# Patient Record
Sex: Female | Born: 1960 | Race: Black or African American | Hispanic: No | Marital: Single | Smoking: Current every day smoker
Health system: Southern US, Community
[De-identification: ages and names within clinical notes are randomized; demographics above are authoritative.]

## PROBLEM LIST (undated history)

## (undated) DIAGNOSIS — E119 Type 2 diabetes mellitus without complications: Secondary | ICD-10-CM

## (undated) DIAGNOSIS — Z720 Tobacco use: Secondary | ICD-10-CM

## (undated) DIAGNOSIS — C801 Malignant (primary) neoplasm, unspecified: Secondary | ICD-10-CM

## (undated) DIAGNOSIS — G629 Polyneuropathy, unspecified: Secondary | ICD-10-CM

## (undated) HISTORY — PX: BREAST SURGERY: SHX581

## (undated) HISTORY — PX: TONSILLECTOMY: SUR1361

## (undated) HISTORY — PX: KNEE ARTHROSCOPY: SUR90

---

## 2014-05-20 ENCOUNTER — Encounter (HOSPITAL_BASED_OUTPATIENT_CLINIC_OR_DEPARTMENT_OTHER): Payer: Self-pay | Admitting: *Deleted

## 2014-05-20 ENCOUNTER — Emergency Department (HOSPITAL_BASED_OUTPATIENT_CLINIC_OR_DEPARTMENT_OTHER)
Admission: EM | Admit: 2014-05-20 | Discharge: 2014-05-20 | Disposition: A | Discharge status: Home / Self Care | Payer: Self-pay | Attending: Emergency Medicine | Admitting: Emergency Medicine

## 2014-05-20 DIAGNOSIS — E119 Type 2 diabetes mellitus without complications: Secondary | ICD-10-CM | POA: Insufficient documentation

## 2014-05-20 DIAGNOSIS — Z720 Tobacco use: Secondary | ICD-10-CM | POA: Insufficient documentation

## 2014-05-20 DIAGNOSIS — N61 Inflammatory disorders of breast: Secondary | ICD-10-CM | POA: Insufficient documentation

## 2014-05-20 DIAGNOSIS — N611 Abscess of the breast and nipple: Secondary | ICD-10-CM

## 2014-05-20 MED ORDER — HYDROCODONE-ACETAMINOPHEN 5-325 MG PO TABS: 1.0000 | ORAL_TABLET | Freq: Four times a day (QID) | ORAL | Status: DC | PRN

## 2014-05-20 MED ORDER — AMOXICILLIN-POT CLAVULANATE 875-125 MG PO TABS
1.0000 | ORAL_TABLET | Freq: Once | ORAL | Status: AC
  Administered 2014-05-20: 1 via ORAL
  Filled 2014-05-20: qty 1

## 2014-05-20 MED ORDER — HYDROCODONE-ACETAMINOPHEN 5-325 MG PO TABS
1.0000 | ORAL_TABLET | Freq: Once | ORAL | Status: AC
  Administered 2014-05-20: 1 via ORAL
  Filled 2014-05-20: qty 1

## 2014-05-20 MED ORDER — AMOXICILLIN-POT CLAVULANATE 875-125 MG PO TABS: 1.0000 | ORAL_TABLET | Freq: Two times a day (BID) | ORAL | Status: AC

## 2014-05-20 NOTE — ED Provider Notes (Signed)
CSN: [id number]     Arrival date & time 05/20/14  1527 History   First MD Initiated Contact with Patient 05/20/14 1555     Chief Complaint  Patient presents with  . Abscess     Patient is a 54 y.o. female presenting with abscess. The history is provided by the patient. No language interpreter was used.  Abscess  Ms. Bun presents for evaluation of right breast pain. She's had pain and swelling in the right breast for the last 5 days. She has a history of infection in that breast about 12 years ago that required surgery. She is a diabetic and takes metformin. She denies any other medical problems. She denies any fevers, vomiting, abdominal pain, dysuria. Symptoms are moderate, constant, worsening.  History reviewed. No pertinent past medical history. Past Surgical History  Procedure Laterality Date  . Knee arthroscopy    . Tonsillectomy     No family history on file. History  Substance Use Topics  . Smoking status: Current Every Day Smoker -- 0.50 packs/day    Types: Cigarettes  . Smokeless tobacco: Not on file  . Alcohol Use: No   OB History    No data available     Review of Systems  All other systems reviewed and are negative.     Allergies  Review of patient's allergies indicates no known allergies.  Home Medications   Prior to Admission medications   Not on File   BP 121/83 mmHg  Pulse 86  Temp(Src) 98.4 F (36.9 C) (Oral)  Resp 18  Ht 5\' 8"  (1.727 m)  Wt 230 lb (104.327 kg)  BMI 34.98 kg/m2  SpO2 100% Physical Exam  Constitutional: She is oriented to person, place, and time. She appears well-developed and well-nourished.  HENT:  Head: Normocephalic and atraumatic.  Cardiovascular: Normal rate and regular rhythm.   Pulmonary/Chest: Effort normal. No respiratory distress.  Right breast with 4x4cm area of erythema, induration and tenderness along right lateral areola consistent with abscess.  There is no surrounding cellulitis.    Abdominal: Soft.  There is no tenderness. There is no rebound and no guarding.  Musculoskeletal: She exhibits no edema or tenderness.  Neurological: She is alert and oriented to person, place, and time.  Skin: Skin is warm and dry.  Psychiatric: She has a normal mood and affect. Her behavior is normal.  Nursing note and vitals reviewed.   ED Course  Procedures (including critical care time) Labs Review Labs Reviewed - No data to display  Imaging Review No results found.   EKG Interpretation None      MDM   Final diagnoses:  Breast abscess    Patient here for evaluation of right breast pain. Exam demonstrates an abscess without surrounding cellulitis. Patient has no systemic symptoms and is nontoxic appearing. Discussed with Dr. Hassell Done who is on-call for surgery, will arrange for follow-up in the acute clinic tomorrow. Recommend starting patient on Augmentin. Discussed with patient and care and return precautions as well as the importance of very close follow-up.    Quintella Reichert, MD 05/20/14 [phone]

## 2014-05-20 NOTE — Discharge Instructions (Signed)

## 2014-05-20 NOTE — ED Notes (Signed)
Pain in her right breast for a week. Hot to touch, painful and swollen.

## 2014-05-21 ENCOUNTER — Observation Stay (HOSPITAL_COMMUNITY)
Admission: EM | Admit: 2014-05-21 | Discharge: 2014-05-22 | Disposition: A | Discharge status: Home / Self Care | Payer: Self-pay | Attending: Surgery | Admitting: Surgery

## 2014-05-21 ENCOUNTER — Encounter (HOSPITAL_COMMUNITY): Payer: Self-pay

## 2014-05-21 ENCOUNTER — Observation Stay (HOSPITAL_COMMUNITY): Payer: Self-pay | Admitting: Certified Registered Nurse Anesthetist

## 2014-05-21 ENCOUNTER — Encounter (HOSPITAL_COMMUNITY)
Admission: EM | Disposition: A | Discharge status: Home / Self Care | Payer: Self-pay | Source: Home / Self Care | Attending: Emergency Medicine

## 2014-05-21 DIAGNOSIS — E119 Type 2 diabetes mellitus without complications: Secondary | ICD-10-CM

## 2014-05-21 DIAGNOSIS — F17210 Nicotine dependence, cigarettes, uncomplicated: Secondary | ICD-10-CM | POA: Insufficient documentation

## 2014-05-21 DIAGNOSIS — Z720 Tobacco use: Secondary | ICD-10-CM

## 2014-05-21 DIAGNOSIS — N611 Abscess of the breast and nipple: Secondary | ICD-10-CM | POA: Diagnosis present

## 2014-05-21 DIAGNOSIS — N61 Inflammatory disorders of breast: Principal | ICD-10-CM | POA: Insufficient documentation

## 2014-05-21 HISTORY — DX: Tobacco use: Z72.0

## 2014-05-21 HISTORY — DX: Type 2 diabetes mellitus without complications: E11.9

## 2014-05-21 HISTORY — PX: IRRIGATION AND DEBRIDEMENT ABSCESS: SHX5252

## 2014-05-21 LAB — CBC WITH DIFFERENTIAL/PLATELET
BASOS PCT: 1 % (ref 0–1)
Basophils Absolute: 0.1 10*3/uL (ref 0.0–0.1)
EOS ABS: 0.2 10*3/uL (ref 0.0–0.7)
Eosinophils Relative: 2 % (ref 0–5)
HCT: 38.4 % (ref 36.0–46.0)
HEMOGLOBIN: 12.9 g/dL (ref 12.0–15.0)
LYMPHS ABS: 3.4 10*3/uL (ref 0.7–4.0)
LYMPHS PCT: 37 % (ref 12–46)
MCH: 28.5 pg (ref 26.0–34.0)
MCHC: 33.6 g/dL (ref 30.0–36.0)
MCV: 85 fL (ref 78.0–100.0)
Monocytes Absolute: 0.7 10*3/uL (ref 0.1–1.0)
Monocytes Relative: 8 % (ref 3–12)
Neutro Abs: 4.8 10*3/uL (ref 1.7–7.7)
Neutrophils Relative %: 52 % (ref 43–77)
Platelets: 226 10*3/uL (ref 150–400)
RBC: 4.52 MIL/uL (ref 3.87–5.11)
RDW: 14.4 % (ref 11.5–15.5)
WBC: 9.1 10*3/uL (ref 4.0–10.5)

## 2014-05-21 LAB — I-STAT CHEM 8, ED
BUN: 12 mg/dL (ref 6–23)
CHLORIDE: 104 mmol/L (ref 96–112)
CREATININE: 0.8 mg/dL (ref 0.50–1.10)
Calcium, Ion: 1.19 mmol/L (ref 1.12–1.23)
Glucose, Bld: 193 mg/dL — ABNORMAL HIGH (ref 70–99)
HEMATOCRIT: 40 % (ref 36.0–46.0)
HEMOGLOBIN: 13.6 g/dL (ref 12.0–15.0)
POTASSIUM: 3.8 mmol/L (ref 3.5–5.1)
Sodium: 140 mmol/L (ref 135–145)
TCO2: 23 mmol/L (ref 0–100)

## 2014-05-21 LAB — SURGICAL PCR SCREEN
MRSA, PCR: NEGATIVE
STAPHYLOCOCCUS AUREUS: NEGATIVE

## 2014-05-21 LAB — PREGNANCY, URINE: Preg Test, Ur: NEGATIVE

## 2014-05-21 LAB — GLUCOSE, CAPILLARY
GLUCOSE-CAPILLARY: 98 mg/dL (ref 70–99)
GLUCOSE-CAPILLARY: 104 mg/dL — AB (ref 70–99)
Glucose-Capillary: 140 mg/dL — ABNORMAL HIGH (ref 70–99)
Glucose-Capillary: 206 mg/dL — ABNORMAL HIGH (ref 70–99)

## 2014-05-21 SURGERY — IRRIGATION AND DEBRIDEMENT ABSCESS
Anesthesia: General | Laterality: Right

## 2014-05-21 MED ORDER — MORPHINE SULFATE 4 MG/ML IJ SOLN
4.0000 mg | Freq: Once | INTRAMUSCULAR | Status: AC
  Administered 2014-05-21: 4 mg via INTRAVENOUS
  Filled 2014-05-21: qty 1

## 2014-05-21 MED ORDER — FENTANYL CITRATE 0.05 MG/ML IJ SOLN
INTRAMUSCULAR | Status: DC | PRN
  Administered 2014-05-21: 100 ug via INTRAVENOUS
  Administered 2014-05-21: 50 ug via INTRAVENOUS

## 2014-05-21 MED ORDER — LACTATED RINGERS IV SOLN: INTRAVENOUS | Status: DC

## 2014-05-21 MED ORDER — BUPIVACAINE-EPINEPHRINE (PF) 0.25% -1:200000 IJ SOLN
INTRAMUSCULAR | Status: AC
  Filled 2014-05-21: qty 30

## 2014-05-21 MED ORDER — FENTANYL CITRATE 0.05 MG/ML IJ SOLN: 25.0000 ug | INTRAMUSCULAR | Status: DC | PRN

## 2014-05-21 MED ORDER — FENTANYL CITRATE 0.05 MG/ML IJ SOLN
INTRAMUSCULAR | Status: AC
  Filled 2014-05-21: qty 5

## 2014-05-21 MED ORDER — ROCURONIUM BROMIDE 100 MG/10ML IV SOLN
INTRAVENOUS | Status: AC
  Filled 2014-05-21: qty 1

## 2014-05-21 MED ORDER — GLYCOPYRROLATE 0.2 MG/ML IJ SOLN
INTRAMUSCULAR | Status: AC
  Filled 2014-05-21: qty 2

## 2014-05-21 MED ORDER — LACTATED RINGERS IV SOLN
INTRAVENOUS | Status: DC
  Administered 2014-05-21: 15:00:00 via INTRAVENOUS

## 2014-05-21 MED ORDER — LIDOCAINE HCL (CARDIAC) 20 MG/ML IV SOLN
INTRAVENOUS | Status: DC | PRN
  Administered 2014-05-21: 50 mg via INTRAVENOUS

## 2014-05-21 MED ORDER — NEOSTIGMINE METHYLSULFATE 10 MG/10ML IV SOLN
INTRAVENOUS | Status: DC | PRN
  Administered 2014-05-21: 3 mg via INTRAVENOUS

## 2014-05-21 MED ORDER — INSULIN ASPART 100 UNIT/ML ~~LOC~~ SOLN: 0.0000 [IU] | Freq: Three times a day (TID) | SUBCUTANEOUS | Status: DC

## 2014-05-21 MED ORDER — ACETAMINOPHEN 325 MG PO TABS: 650.0000 mg | ORAL_TABLET | Freq: Four times a day (QID) | ORAL | Status: DC | PRN

## 2014-05-21 MED ORDER — ONDANSETRON HCL 4 MG/2ML IJ SOLN: 4.0000 mg | Freq: Four times a day (QID) | INTRAMUSCULAR | Status: DC | PRN

## 2014-05-21 MED ORDER — MIDAZOLAM HCL 2 MG/2ML IJ SOLN
INTRAMUSCULAR | Status: AC
  Filled 2014-05-21: qty 2

## 2014-05-21 MED ORDER — HYDROCODONE-ACETAMINOPHEN 5-325 MG PO TABS
1.0000 | ORAL_TABLET | ORAL | Status: DC | PRN
  Administered 2014-05-21: 2 via ORAL
  Administered 2014-05-22: 1 via ORAL
  Filled 2014-05-21: qty 1
  Filled 2014-05-21: qty 2

## 2014-05-21 MED ORDER — ROCURONIUM BROMIDE 100 MG/10ML IV SOLN
INTRAVENOUS | Status: DC | PRN
  Administered 2014-05-21: 10 mg via INTRAVENOUS

## 2014-05-21 MED ORDER — POTASSIUM CHLORIDE IN NACL 20-0.9 MEQ/L-% IV SOLN
INTRAVENOUS | Status: DC
  Administered 2014-05-21: 50 mL/h via INTRAVENOUS
  Filled 2014-05-21 (×2): qty 1000

## 2014-05-21 MED ORDER — ENOXAPARIN SODIUM 40 MG/0.4ML ~~LOC~~ SOLN
40.0000 mg | SUBCUTANEOUS | Status: DC
  Administered 2014-05-21: 40 mg via SUBCUTANEOUS
  Filled 2014-05-21 (×2): qty 0.4

## 2014-05-21 MED ORDER — SODIUM CHLORIDE 0.9 % IV SOLN
3.0000 g | Freq: Four times a day (QID) | INTRAVENOUS | Status: DC
  Administered 2014-05-21 – 2014-05-22 (×3): 3 g via INTRAVENOUS
  Filled 2014-05-21 (×6): qty 3

## 2014-05-21 MED ORDER — ONDANSETRON HCL 4 MG/2ML IJ SOLN
INTRAMUSCULAR | Status: DC | PRN
  Administered 2014-05-21: 4 mg via INTRAVENOUS

## 2014-05-21 MED ORDER — LIDOCAINE HCL (CARDIAC) 20 MG/ML IV SOLN
INTRAVENOUS | Status: AC
  Filled 2014-05-21: qty 5

## 2014-05-21 MED ORDER — ACETAMINOPHEN 650 MG RE SUPP: 650.0000 mg | Freq: Four times a day (QID) | RECTAL | Status: DC | PRN

## 2014-05-21 MED ORDER — PROPOFOL 10 MG/ML IV BOLUS
INTRAVENOUS | Status: DC | PRN
  Administered 2014-05-21: 50 mg via INTRAVENOUS
  Administered 2014-05-21: 150 mg via INTRAVENOUS

## 2014-05-21 MED ORDER — HYDROCODONE-ACETAMINOPHEN 5-325 MG PO TABS: 1.0000 | ORAL_TABLET | ORAL | Status: DC | PRN

## 2014-05-21 MED ORDER — ACETAMINOPHEN 325 MG PO TABS: 650.0000 mg | ORAL_TABLET | ORAL | Status: DC | PRN

## 2014-05-21 MED ORDER — ONDANSETRON HCL 4 MG/2ML IJ SOLN
INTRAMUSCULAR | Status: AC
  Filled 2014-05-21: qty 2

## 2014-05-21 MED ORDER — SUCCINYLCHOLINE CHLORIDE 20 MG/ML IJ SOLN
INTRAMUSCULAR | Status: DC | PRN
  Administered 2014-05-21: 100 mg via INTRAVENOUS

## 2014-05-21 MED ORDER — BUPIVACAINE HCL (PF) 0.25 % IJ SOLN
INTRAMUSCULAR | Status: DC | PRN
  Administered 2014-05-21: 10 mL

## 2014-05-21 MED ORDER — HYDROMORPHONE HCL 1 MG/ML IJ SOLN: 1.0000 mg | INTRAMUSCULAR | Status: DC | PRN

## 2014-05-21 MED ORDER — NEOSTIGMINE METHYLSULFATE 10 MG/10ML IV SOLN
INTRAVENOUS | Status: AC
  Filled 2014-05-21: qty 1

## 2014-05-21 MED ORDER — MORPHINE SULFATE 2 MG/ML IJ SOLN
2.0000 mg | INTRAMUSCULAR | Status: DC | PRN
Start: 2014-05-21 — End: 2014-05-22
  Administered 2014-05-21: 2 mg via INTRAVENOUS
  Filled 2014-05-21: qty 1

## 2014-05-21 MED ORDER — PROPOFOL 10 MG/ML IV BOLUS
INTRAVENOUS | Status: AC
  Filled 2014-05-21: qty 20

## 2014-05-21 MED ORDER — ONDANSETRON HCL 4 MG PO TABS: 4.0000 mg | ORAL_TABLET | Freq: Four times a day (QID) | ORAL | Status: DC | PRN

## 2014-05-21 MED ORDER — GLYCOPYRROLATE 0.2 MG/ML IJ SOLN
INTRAMUSCULAR | Status: DC | PRN
  Administered 2014-05-21: 0.4 mg via INTRAVENOUS

## 2014-05-21 MED ORDER — LACTATED RINGERS IV SOLN
INTRAVENOUS | Status: DC
  Administered 2014-05-21: 1000 mL via INTRAVENOUS

## 2014-05-21 MED ORDER — MIDAZOLAM HCL 5 MG/5ML IJ SOLN
INTRAMUSCULAR | Status: DC | PRN
Start: 2014-05-21 — End: 2014-05-21
  Administered 2014-05-21: 2 mg via INTRAVENOUS

## 2014-05-21 MED ORDER — 0.9 % SODIUM CHLORIDE (POUR BTL) OPTIME
TOPICAL | Status: DC | PRN
  Administered 2014-05-21: 1000 mL

## 2014-05-21 SURGICAL SUPPLY — 33 items
BENZOIN TINCTURE PRP APPL 2/3 (GAUZE/BANDAGES/DRESSINGS) IMPLANT
BLADE HEX COATED 2.75 (ELECTRODE) ×3 IMPLANT
BLADE SURG SZ10 CARB STEEL (BLADE) ×3 IMPLANT
CLOSURE WOUND 1/2 X4 (GAUZE/BANDAGES/DRESSINGS)
DECANTER SPIKE VIAL GLASS SM (MISCELLANEOUS) IMPLANT
DRAIN CHANNEL RND F F (WOUND CARE) IMPLANT
DRAPE LAPAROTOMY T 102X78X121 (DRAPES) IMPLANT
DRAPE LAPAROTOMY TRNSV 102X78 (DRAPE) IMPLANT
DRAPE SHEET LG 3/4 BI-LAMINATE (DRAPES) IMPLANT
ELECT REM PT RETURN 9FT ADLT (ELECTROSURGICAL) ×3
ELECTRODE REM PT RTRN 9FT ADLT (ELECTROSURGICAL) ×1 IMPLANT
EVACUATOR SILICONE 100CC (DRAIN) IMPLANT
GAUZE IODOFORM PACK 1/2 7832 (GAUZE/BANDAGES/DRESSINGS) ×3 IMPLANT
GAUZE SPONGE 4X4 12PLY STRL (GAUZE/BANDAGES/DRESSINGS) ×3 IMPLANT
GLOVE BIOGEL PI IND STRL 7.0 (GLOVE) ×1 IMPLANT
GLOVE BIOGEL PI INDICATOR 7.0 (GLOVE) ×2
GLOVE SURG ORTHO 8.0 STRL STRW (GLOVE) ×3 IMPLANT
GOWN STRL REUS W/TWL LRG LVL3 (GOWN DISPOSABLE) ×3 IMPLANT
GOWN STRL REUS W/TWL XL LVL3 (GOWN DISPOSABLE) ×6 IMPLANT
KIT BASIN OR (CUSTOM PROCEDURE TRAY) ×3 IMPLANT
MARKER SKIN DUAL TIP RULER LAB (MISCELLANEOUS) IMPLANT
NEEDLE HYPO 25X1 1.5 SAFETY (NEEDLE) ×3 IMPLANT
NS IRRIG 1000ML POUR BTL (IV SOLUTION) ×3 IMPLANT
PACK GENERAL/GYN (CUSTOM PROCEDURE TRAY) ×3 IMPLANT
SPONGE LAP 18X18 X RAY DECT (DISPOSABLE) IMPLANT
STAPLER VISISTAT 35W (STAPLE) IMPLANT
STRIP CLOSURE SKIN 1/2X4 (GAUZE/BANDAGES/DRESSINGS) IMPLANT
SUT ETHILON 3 0 PS 1 (SUTURE) IMPLANT
SUT MNCRL AB 4-0 PS2 18 (SUTURE) IMPLANT
SUT VIC AB 3-0 SH 18 (SUTURE) IMPLANT
SYR CONTROL 10ML LL (SYRINGE) ×3 IMPLANT
TAPE CLOTH SURG 4X10 WHT LF (GAUZE/BANDAGES/DRESSINGS) ×3 IMPLANT
TOWEL OR 17X26 10 PK STRL BLUE (TOWEL DISPOSABLE) ×3 IMPLANT

## 2014-05-21 NOTE — ED Notes (Signed)
Pt reports abscess to right breast/nipple area. Pt reports hx of such.

## 2014-05-21 NOTE — Transfer of Care (Signed)
Immediate Anesthesia Transfer of Care Note  Patient: Sheena Ward  Procedure(s) Performed: Procedure(s): IRRIGATION AND DEBRIDEMENT ABSCESS (Right)  Patient Location: PACU  Anesthesia Type:General  Level of Consciousness: awake, alert  and oriented  Airway & Oxygen Therapy: Patient Spontanous Breathing and Patient connected to face mask oxygen  Post-op Assessment: Report given to RN and Post -op Vital signs reviewed and stable  Post vital signs: Reviewed and stable  Last Vitals:  Filed Vitals:   05/21/14 1246  BP: 106/61  Pulse: 59  Temp: 37.2 C  Resp: 18    Complications: No apparent anesthesia complications

## 2014-05-21 NOTE — Brief Op Note (Signed)
05/21/2014  3:26 PM  PATIENT:  Sheena Ward  54 y.o. female  PRE-OPERATIVE DIAGNOSIS:  right breast abcess  POST-OPERATIVE DIAGNOSIS:  right breast abcess  PROCEDURE:  Procedure(s): IRRIGATION AND DEBRIDEMENT ABSCESS (Right)  SURGEON:  Surgeon(s) and Role:    * Armandina Gemma, MD - Primary  ANESTHESIA:   general  EBL:     BLOOD ADMINISTERED:none  DRAINS: none   LOCAL MEDICATIONS USED:  MARCAINE     SPECIMEN:  No Specimen  DISPOSITION OF SPECIMEN:  N/A  COUNTS:  YES  TOURNIQUET:  * No tourniquets in log *  DICTATION: .Other Dictation: Dictation Number [id number]  PLAN OF CARE: Admit for overnight observation  PATIENT DISPOSITION:  PACU - hemodynamically stable.   Delay start of Pharmacological VTE agent (>24hrs) due to surgical blood loss or risk of bleeding: yes  [name], MD, [facility] Office: [phone]

## 2014-05-21 NOTE — Anesthesia Postprocedure Evaluation (Signed)
  Anesthesia Post-op Note  Patient: Sheena Ward  Procedure(s) Performed: Procedure(s) (LRB): IRRIGATION AND DEBRIDEMENT ABSCESS (Right)  Patient Location: PACU  Anesthesia Type: General  Level of Consciousness: awake and alert   Airway and Oxygen Therapy: Patient Spontanous Breathing  Post-op Pain: mild  Post-op Assessment: Post-op Vital signs reviewed, Patient's Cardiovascular Status Stable, Respiratory Function Stable, Patent Airway and No signs of Nausea or vomiting  Last Vitals:  Filed Vitals:   05/21/14 1623  BP: 106/61  Pulse: 58  Temp: 36.6 C  Resp: 16    Post-op Vital Signs: stable   Complications: No apparent anesthesia complications

## 2014-05-21 NOTE — Anesthesia Preprocedure Evaluation (Addendum)
Anesthesia Evaluation  Patient identified by MRN, date of birth, ID band Patient awake    Reviewed: Allergy & Precautions, H&P , NPO status , Patient's Chart, lab work & pertinent test results  Airway Mallampati: II  TM Distance: >3 FB Neck ROM: full    Dental  (+) Missing, Dental Advisory Given Left upper lateral are all missing:   Pulmonary Current Smoker,  breath sounds clear to auscultation  Pulmonary exam normal       Cardiovascular Exercise Tolerance: Good negative cardio ROS  Rhythm:regular Rate:Normal     Neuro/Psych negative neurological ROS  negative psych ROS   GI/Hepatic negative GI ROS, Neg liver ROS,   Endo/Other  diabetes, Well Controlled, Type 2, Oral Hypoglycemic Agents  Renal/GU negative Renal ROS  negative genitourinary   Musculoskeletal   Abdominal   Peds  Hematology negative hematology ROS (+)   Anesthesia Other Findings   Reproductive/Obstetrics negative OB ROS                           Anesthesia Physical Anesthesia Plan  ASA: III and emergent  Anesthesia Plan: General   Post-op Pain Management:    Induction: Intravenous, Rapid sequence and Cricoid pressure planned  Airway Management Planned: Oral ETT  Additional Equipment:   Intra-op Plan:   Post-operative Plan: Extubation in OR  Informed Consent: I have reviewed the patients History and Physical, chart, labs and discussed the procedure including the risks, benefits and alternatives for the proposed anesthesia with the patient or authorized representative who has indicated his/her understanding and acceptance.   Dental Advisory Given  Plan Discussed with: CRNA and Surgeon  Anesthesia Plan Comments:        Anesthesia Quick Evaluation

## 2014-05-21 NOTE — ED Provider Notes (Signed)
CSN: [id number]     Arrival date & time 05/21/14  9233 History   First MD Initiated Contact with Patient 05/21/14 [phone]     Chief Complaint  Patient presents with  . Abscess    HPI Patient presents to the emergency room with complaints of right breast pain for the past week. The patient has noticed swelling and tenderness beneath the right nipple. The patient was seen at [place] emergency room yesterday. The patient was diagnosed with a breast abscess and was referred to the general surgeon. I spoke with the surgeon on call yesterday and the patient was supposed to be seen in the office today. When the patient called the office today however she was told she could not be seen there because she did not have any insurance. She called back the emergency department and asked what she should do and she was told to come to the emergency room at [facility]. Denies any fevers or chills.  She does have diabetes but has not checked her blood sugar recently.  She last ate something this morning. Past Medical History  Diagnosis Date  . Diabetes mellitus 05/21/2014  . Tobacco use 05/21/2014   Past Surgical History  Procedure Laterality Date  . Knee arthroscopy    . Tonsillectomy     History reviewed. No pertinent family history. History  Substance Use Topics  . Smoking status: Current Every Day Smoker -- 0.50 packs/day    Types: Cigarettes  . Smokeless tobacco: Not on file  . Alcohol Use: No   OB History    No data available     Review of Systems  All other systems reviewed and are negative.     Allergies  Review of patient's allergies indicates no known allergies.  Home Medications   Prior to Admission medications   Medication Sig Start Date End Date Taking? Authorizing Provider  amoxicillin-clavulanate (AUGMENTIN) 875-125 MG per tablet Take 1 tablet by mouth every 12 (twelve) hours. Patient taking differently: Take 1 tablet by mouth every 12 (twelve) hours. She is to take for 7 days. She  started on 05/20/14 and has not completed. 05/20/14  Yes Quintella Reichert, MD  HYDROcodone-acetaminophen (NORCO/VICODIN) 5-325 MG per tablet Take 1 tablet by mouth every 6 (six) hours as needed for moderate pain or severe pain. 05/20/14  Yes Quintella Reichert, MD   BP 137/92 mmHg  Pulse 71  Temp(Src) 97.9 F (36.6 C) (Oral)  Resp 18  SpO2 100% Physical Exam  Constitutional: She appears well-developed and well-nourished. No distress.  HENT:  Head: Normocephalic and atraumatic.  Right Ear: External ear normal.  Left Ear: External ear normal.  Eyes: Conjunctivae are normal. Right eye exhibits no discharge. Left eye exhibits no discharge. No scleral icterus.  Neck: Neck supple. No tracheal deviation present.  Cardiovascular: Normal rate.   Pulmonary/Chest: Effort normal. No stridor. No respiratory distress.    Area of fluctuance and tenderness to with swelling below the right nipple around the 7:00 position, surrounding erythema and induration, no discrete mass palpated, no drainage  Musculoskeletal: She exhibits no edema.  Neurological: She is alert. Cranial nerve deficit: no gross deficits.  Skin: Skin is warm and dry. No rash noted.  Psychiatric: She has a normal mood and affect.  Nursing note and vitals reviewed.   ED Course  Procedures (including critical care time) Labs Review Labs Reviewed  I-STAT CHEM 8, ED - Abnormal; Notable for the following:    Glucose, Bld 193 (*)  All other components within normal limits  CBC WITH DIFFERENTIAL/PLATELET    Medications  Ampicillin-Sulbactam (UNASYN) 3 g in sodium chloride 0.9 % 100 mL IVPB (not administered)  morphine 4 MG/ML injection 4 mg (4 mg Intravenous Given 05/21/14 0930)     MDM   Final diagnoses:  Breast abscess    The patient's exam is consistent with a breast abscess. She was referred for general surgery consultation considering the location.  I will consult the surgeon on call.  Discussed with Dr Catalina Antigua.  Plans on  taking pt to or for I&D    Dorie Rank, MD 05/21/14 1027

## 2014-05-21 NOTE — Anesthesia Procedure Notes (Signed)
Procedure Name: Intubation Date/Time: 05/21/2014 3:14 PM Performed by: Dimas Millin, Brallan Denio F Pre-anesthesia Checklist: Patient identified, Emergency Drugs available, Suction available, Patient being monitored and Timeout performed Patient Re-evaluated:Patient Re-evaluated prior to inductionOxygen Delivery Method: Circle system utilized Preoxygenation: Pre-oxygenation with 100% oxygen Intubation Type: IV induction and Cricoid Pressure applied Ventilation: Mask ventilation without difficulty Laryngoscope Size: Miller and 2 Grade View: Grade I Tube type: Oral Number of attempts: 1 Airway Equipment and Method: Stylet Placement Confirmation: ETT inserted through vocal cords under direct vision,  positive ETCO2 and breath sounds checked- equal and bilateral Secured at: 21 cm Tube secured with: Tape Dental Injury: Teeth and Oropharynx as per pre-operative assessment

## 2014-05-21 NOTE — H&P (Signed)
Chief Complaint: Right breast pain HPI: Sheena Ward is a 54 year old female with a history of diabetes mellitus who presents with right sided breast pain.  Duration of symptoms is 6 days.  Onset was gradual.  Coarse is worsening.  Time pattern is constant.  Moderate in severity.  Characterized as a tooth ache.  No modifying factors.  No aggravating or alleviating factors.  She was seen at [facility] yesterday, started on Augmentin and referred to our clinic.  She denies fever or chills.  Denies drainage or nipple inversion.  Denies history of breast ca.  Previous symptoms about 12 years ago which required i&d.  Risk factors include; diabetes mellitus and tobacco use. She has a normal white count.  She has been NPO since 8AM.  Past Medical History  Diagnosis Date  . Diabetes mellitus 05/21/2014  . Tobacco use 05/21/2014    Past Surgical History  Procedure Laterality Date  . Knee arthroscopy    . Tonsillectomy      History reviewed. No pertinent family history. Social History:  reports that she has been smoking Cigarettes.  She has been smoking about 0.50 packs per day. She does not have any smokeless tobacco history on file. She reports that she does not drink alcohol or use illicit drugs.  Allergies: No Known Allergies Medication: Prior to Admission medications   Medication Sig Start Date End Date Taking? Authorizing Provider  amoxicillin-clavulanate (AUGMENTIN) 875-125 MG per tablet Take 1 tablet by mouth every 12 (twelve) hours. Patient taking differently: Take 1 tablet by mouth every 12 (twelve) hours. She is to take for 7 days. She started on 05/20/14 and has not completed. 05/20/14  Yes Quintella Reichert, MD  HYDROcodone-acetaminophen (NORCO/VICODIN) 5-325 MG per tablet Take 1 tablet by mouth every 6 (six) hours as needed for moderate pain or severe pain. 05/20/14  Yes Quintella Reichert, MD     (Not in a hospital admission)  Results for orders placed or performed during the hospital encounter of  05/21/14 (from the past 48 hour(s))  CBC with Differential     Status: None   Collection Time: 05/21/14  9:27 AM  Result Value Ref Range   WBC 9.1 4.0 - 10.5 K/uL   RBC 4.52 3.87 - 5.11 MIL/uL   Hemoglobin 12.9 12.0 - 15.0 g/dL   HCT 38.4 36.0 - 46.0 %   MCV 85.0 78.0 - 100.0 fL   MCH 28.5 26.0 - 34.0 pg   MCHC 33.6 30.0 - 36.0 g/dL   RDW 14.4 11.5 - 15.5 %   Platelets 226 150 - 400 K/uL   Neutrophils Relative % 52 43 - 77 %   Neutro Abs 4.8 1.7 - 7.7 K/uL   Lymphocytes Relative 37 12 - 46 %   Lymphs Abs 3.4 0.7 - 4.0 K/uL   Monocytes Relative 8 3 - 12 %   Monocytes Absolute 0.7 0.1 - 1.0 K/uL   Eosinophils Relative 2 0 - 5 %   Eosinophils Absolute 0.2 0.0 - 0.7 K/uL   Basophils Relative 1 0 - 1 %   Basophils Absolute 0.1 0.0 - 0.1 K/uL  I-stat chem 8, ed     Status: Abnormal   Collection Time: 05/21/14  9:39 AM  Result Value Ref Range   Sodium 140 135 - 145 mmol/L   Potassium 3.8 3.5 - 5.1 mmol/L   Chloride 104 96 - 112 mmol/L   BUN 12 6 - 23 mg/dL   Creatinine, Ser 0.80 0.50 - 1.10 mg/dL  Glucose, Bld 193 (H) 70 - 99 mg/dL   Calcium, Ion 1.19 1.12 - 1.23 mmol/L   TCO2 23 0 - 100 mmol/L   Hemoglobin 13.6 12.0 - 15.0 g/dL   HCT 40.0 36.0 - 46.0 %   No results found.  Review of Systems  All other systems reviewed and are negative.   Blood pressure 142/78, pulse 79, temperature 97.9 F (36.6 C), temperature source Oral, resp. rate 20, SpO2 100 %. Physical Exam  Constitutional: She is oriented to person, place, and time. She appears well-developed and well-nourished. No distress.  Cardiovascular: Normal rate, regular rhythm, normal heart sounds and intact distal pulses.  Exam reveals no gallop and no friction rub.   No murmur heard. Respiratory: Effort normal and breath sounds normal. No respiratory distress. She has no wheezes. She has no rales. She exhibits no tenderness.  GI: Soft. Bowel sounds are normal. She exhibits no distension. There is no tenderness.   Musculoskeletal: She exhibits no edema or tenderness.  Neurological: She is alert and oriented to person, place, and time.  Skin: Skin is warm and dry. No rash noted. She is not diaphoretic. No erythema. No pallor.  Right breast abscess/nipple-erythema, 3x4cm area of fluctuance, tenderness with small amount of surrounding erythema no drainage.   Psychiatric: She has a normal mood and affect. Her behavior is normal. Judgment and thought content normal.     Assessment/Plan Right breast abscess -to OR later today for an I&D -NPO -IVF -unasyn -SCD/lovenox DM II -SSI, CBGs -hold metformin  Kenyon Eichelberger ANP-BC 05/21/2014, 10:12 AM

## 2014-05-21 NOTE — ED Notes (Signed)
Pt c/o R breast abscess x 1 week.  Pain score 10/10.  Pt was seen yesterday at [facility] for same and started on Augmentin.  Pt reports that she was referred to a clinic, but they would not see her.  Sts she does not have insurance.  Sts "the head nurse sent me over here."

## 2014-05-22 ENCOUNTER — Encounter (HOSPITAL_COMMUNITY): Payer: Self-pay | Admitting: Surgery

## 2014-05-22 LAB — CBC
HEMATOCRIT: 34.1 % — AB (ref 36.0–46.0)
Hemoglobin: 11.3 g/dL — ABNORMAL LOW (ref 12.0–15.0)
MCH: 28.5 pg (ref 26.0–34.0)
MCHC: 33.1 g/dL (ref 30.0–36.0)
MCV: 85.9 fL (ref 78.0–100.0)
Platelets: 204 10*3/uL (ref 150–400)
RBC: 3.97 MIL/uL (ref 3.87–5.11)
RDW: 14.4 % (ref 11.5–15.5)
WBC: 10.2 10*3/uL (ref 4.0–10.5)

## 2014-05-22 LAB — GLUCOSE, CAPILLARY: Glucose-Capillary: 112 mg/dL — ABNORMAL HIGH (ref 70–99)

## 2014-05-22 MED ORDER — HYDROCODONE-ACETAMINOPHEN 5-325 MG PO TABS: 1.0000 | ORAL_TABLET | Freq: Four times a day (QID) | ORAL | Status: DC | PRN

## 2014-05-22 NOTE — Discharge Summary (Signed)
  Physician Discharge Summary  Patient ID: Sheena Ward MRN: [id number] DOB/AGE: 54-Feb-1962 53 y.o.  Admit date: 05/21/2014 Discharge date: 05/22/2014  Admitting Diagnosis: Right breast abscess   Discharge Diagnosis Patient Active Problem List   Diagnosis Date Noted  . Diabetes mellitus 05/21/2014  . Tobacco use 05/21/2014  . Abscess of right breast 05/21/2014  . Breast abscess 05/21/2014    Consultants none  Imaging: No results found.  Procedures I&D of right breast abscess---Dr. Harlow Asa 05/21/14  Hospital Course:  Sheena Ward is a 54 year old female with a history of diabetes mellitus who presented to [facility] with right sided breast pain. she was found to have a right sided breast abscess and underwent and I&D in the OR.   Tolerated procedure well and was transferred to the floor.  Diet was advanced as tolerated.  On POD#1, the patient was voiding well, tolerating diet, ambulating well, pain well controlled, vital signs stable, wound was clean.  She was therefore felt stable for discharge home. She was instructed on dressing changes.  Warning signs that warrant immediate attention were reviewed.  Medication risks, benefits and therapeutic alternatives were reviewed with the patient.  She verbalizes understanding. Follow up in 2 weeks.    incisions c/d/i and felt stable for discharge home.  Patient will follow up in our office in 2 weeks and knows to call with questions or concerns.  Physical Exam: General:  Alert, NAD, pleasant, comfortable Skin: right breast wound-clean, repacked with 2x2    Medication List    TAKE these medications        amoxicillin-clavulanate 875-125 MG per tablet  Commonly known as:  AUGMENTIN  Take 1 tablet by mouth every 12 (twelve) hours.     HYDROcodone-acetaminophen 5-325 MG per tablet  Commonly known as:  NORCO/VICODIN  Take 1 tablet by mouth every 6 (six) hours as needed for moderate pain or severe pain.         Follow-up  Information    Follow up with [name], MD On 06/05/2014.   Specialty:  General Surgery   Why:  at [place], arrive by 9:30AM   Contact information:   [place] [address] [phone]       Signed: Erby Pian, [facility] [phone]  05/22/2014, 11:20 AM

## 2014-05-22 NOTE — Progress Notes (Signed)
Pt is alert and oriented, vital signs are stable, incision within normal limits, patient is tolerating her diet without complaints of nausea or vomiting, prescription given as well as work note, patient to follow up with [facility] within 2 week for follow up appointment Arsenio Loader 9:37 AM 05-22-2014

## 2014-05-22 NOTE — Discharge Instructions (Signed)

## 2014-05-22 NOTE — Op Note (Signed)
NAMEMAELEY, MATTON              ACCOUNT NO.:  [id number]  MEDICAL RECORD NO.:  [id number]  LOCATION:  [id number]                         FACILITY:  [facility]  PHYSICIAN:  [name], MD      DATE OF BIRTH:  [birth year]  DATE OF PROCEDURE:  05/21/2014                              OPERATIVE REPORT   PREOPERATIVE DIAGNOSIS:  Recurrent right breast abscess.  POSTOPERATIVE DIAGNOSIS:  Recurrent right breast abscess.  PROCEDURE:  Incision and drainage and debridement of right breast abscess.  SURGEON:  [name], MD, FACS  ANESTHESIA:  General.  ESTIMATED BLOOD LOSS:  Minimal.  PREPARATION:  ChloraPrep.  COMPLICATIONS:  None.  INDICATIONS:  The patient is a 54 year old female who presents to the emergency room with recurrent right breast abscess.  She had undergone previous incision and drainage several years ago.  Over the past several days, she had developed swelling and pain of the right breast.  The patient was not able to tolerate bedside procedures.  Given her underlying diabetes, decision was made for admission and operative debridement.  BODY OF REPORT:  Procedure was done in OR #1 at [facility].  The patient was brought to the operating room, placed in supine position on the operating room table.  Following administration of general anesthesia, the patient was positioned and then prepped and draped in the usual aseptic fashion.  After ascertaining that an adequate level of anesthesia had been achieved, the skin around the lateral aspect of the right areola was anesthetized with local anesthetic.  Using the electrocautery, the previous surgical incision was reopened.  An ellipse of skin measuring 2.5 cm x 1 cm.  It was excised full thickness into the subcutaneous tissues.  Purulent fluid is drained.  Aerobic and anaerobic cultures were captured and submitted to the laboratory.  The wound was then irrigated copiously with warm saline.  Using the  electrocautery, the granulation tissue and any areas of bleeding were cauterized.  Devitalized tissue was debrided with the electrocautery used for hemostasis.  Wound was again irrigated with warm saline.  The wound was packed with 0.5 inch iodoform gauze packing and covered with a dry gauze dressing.  The patient was awakened from anesthesia and brought to the recovery room.  The patient tolerated the procedure well.   [name], MD, [facility] Office: [phone]    TMG/MEDQ  D:  05/21/2014  T:  05/22/2014  Job:  [id number]

## 2014-05-25 LAB — CULTURE, ROUTINE-ABSCESS

## 2014-05-26 LAB — ANAEROBIC CULTURE

## 2015-05-24 ENCOUNTER — Emergency Department (HOSPITAL_BASED_OUTPATIENT_CLINIC_OR_DEPARTMENT_OTHER)
Admission: EM | Admit: 2015-05-24 | Discharge: 2015-05-24 | Disposition: A | Discharge status: Home / Self Care | Payer: BLUE CROSS/BLUE SHIELD | Attending: Emergency Medicine | Admitting: Emergency Medicine

## 2015-05-24 ENCOUNTER — Emergency Department (HOSPITAL_BASED_OUTPATIENT_CLINIC_OR_DEPARTMENT_OTHER): Payer: BLUE CROSS/BLUE SHIELD

## 2015-05-24 ENCOUNTER — Encounter (HOSPITAL_BASED_OUTPATIENT_CLINIC_OR_DEPARTMENT_OTHER): Payer: Self-pay | Admitting: *Deleted

## 2015-05-24 DIAGNOSIS — N12 Tubulo-interstitial nephritis, not specified as acute or chronic: Secondary | ICD-10-CM | POA: Diagnosis not present

## 2015-05-24 DIAGNOSIS — R531 Weakness: Secondary | ICD-10-CM | POA: Diagnosis not present

## 2015-05-24 DIAGNOSIS — J029 Acute pharyngitis, unspecified: Secondary | ICD-10-CM | POA: Diagnosis not present

## 2015-05-24 DIAGNOSIS — R0602 Shortness of breath: Secondary | ICD-10-CM | POA: Insufficient documentation

## 2015-05-24 DIAGNOSIS — E119 Type 2 diabetes mellitus without complications: Secondary | ICD-10-CM | POA: Diagnosis not present

## 2015-05-24 DIAGNOSIS — R112 Nausea with vomiting, unspecified: Secondary | ICD-10-CM | POA: Diagnosis present

## 2015-05-24 DIAGNOSIS — F17210 Nicotine dependence, cigarettes, uncomplicated: Secondary | ICD-10-CM | POA: Diagnosis not present

## 2015-05-24 LAB — URINALYSIS, ROUTINE W REFLEX MICROSCOPIC
Bilirubin Urine: NEGATIVE
GLUCOSE, UA: 100 mg/dL — AB
Ketones, ur: 15 mg/dL — AB
Nitrite: POSITIVE — AB
Protein, ur: 100 mg/dL — AB
SPECIFIC GRAVITY, URINE: 1.02 (ref 1.005–1.030)
pH: 6 (ref 5.0–8.0)

## 2015-05-24 LAB — CBC WITH DIFFERENTIAL/PLATELET
BASOS PCT: 0 %
Basophils Absolute: 0 10*3/uL (ref 0.0–0.1)
Eosinophils Absolute: 0.1 10*3/uL (ref 0.0–0.7)
Eosinophils Relative: 1 %
HEMATOCRIT: 38.2 % (ref 36.0–46.0)
Hemoglobin: 12.5 g/dL (ref 12.0–15.0)
LYMPHS PCT: 19 %
Lymphs Abs: 1.8 10*3/uL (ref 0.7–4.0)
MCH: 26.4 pg (ref 26.0–34.0)
MCHC: 32.7 g/dL (ref 30.0–36.0)
MCV: 80.6 fL (ref 78.0–100.0)
MONOS PCT: 13 %
Monocytes Absolute: 1.2 10*3/uL — ABNORMAL HIGH (ref 0.1–1.0)
NEUTROS ABS: 6.2 10*3/uL (ref 1.7–7.7)
Neutrophils Relative %: 67 %
PLATELETS: 175 10*3/uL (ref 150–400)
RBC: 4.74 MIL/uL (ref 3.87–5.11)
RDW: 15.2 % (ref 11.5–15.5)
WBC: 9.3 10*3/uL (ref 4.0–10.5)

## 2015-05-24 LAB — BASIC METABOLIC PANEL
Anion gap: 9 (ref 5–15)
BUN: 18 mg/dL (ref 6–20)
CO2: 25 mmol/L (ref 22–32)
CREATININE: 0.59 mg/dL (ref 0.44–1.00)
Calcium: 8.9 mg/dL (ref 8.9–10.3)
Chloride: 103 mmol/L (ref 101–111)
GFR calc Af Amer: >60 mL/min (ref >60)
GFR calc non Af Amer: >60 mL/min (ref >60)
Glucose, Bld: 161 mg/dL — ABNORMAL HIGH (ref 65–99)
Potassium: 3.3 mmol/L — ABNORMAL LOW (ref 3.5–5.1)
Sodium: 137 mmol/L (ref 135–145)

## 2015-05-24 LAB — CBG MONITORING, ED: Glucose-Capillary: 147 mg/dL — ABNORMAL HIGH (ref 65–99)

## 2015-05-24 LAB — URINE MICROSCOPIC-ADD ON

## 2015-05-24 LAB — TROPONIN I: Troponin I: <0.03 ng/mL (ref <0.031)

## 2015-05-24 MED ORDER — DEXTROSE 5 % IV SOLN
1.0000 g | Freq: Once | INTRAVENOUS | Status: AC
  Administered 2015-05-24: 1 g via INTRAVENOUS
  Filled 2015-05-24: qty 10

## 2015-05-24 MED ORDER — KETOROLAC TROMETHAMINE 30 MG/ML IJ SOLN
30.0000 mg | Freq: Once | INTRAMUSCULAR | Status: AC
  Administered 2015-05-24: 30 mg via INTRAVENOUS
  Filled 2015-05-24: qty 1

## 2015-05-24 MED ORDER — ONDANSETRON 4 MG PO TBDP: 4.0000 mg | ORAL_TABLET | Freq: Three times a day (TID) | ORAL | Status: AC | PRN

## 2015-05-24 MED ORDER — CIPROFLOXACIN HCL 500 MG PO TABS: 500.0000 mg | ORAL_TABLET | Freq: Two times a day (BID) | ORAL | Status: AC

## 2015-05-24 MED ORDER — ONDANSETRON HCL 4 MG/2ML IJ SOLN
4.0000 mg | Freq: Once | INTRAMUSCULAR | Status: AC
  Administered 2015-05-24: 4 mg via INTRAVENOUS
  Filled 2015-05-24: qty 2

## 2015-05-24 MED ORDER — HYDROCODONE-ACETAMINOPHEN 5-325 MG PO TABS: 1.0000 | ORAL_TABLET | ORAL | Status: DC | PRN

## 2015-05-24 NOTE — ED Notes (Signed)
DC instructions reviewed with pt along with dx by EDP. Pt teaching and reviewed the three Rxs as written by EDP, discussed the importance of taking and completing all of the abx as prescribed, also discussed safety while taking PO pain meds, also pt teaching done re: using PO zofran for nausea. Opportunity for questions provided. Teach Back Method used

## 2015-05-24 NOTE — ED Notes (Signed)
PT reports feeling SOB since Thursday, worse with exertion. Also headache and nausea.

## 2015-05-24 NOTE — ED Notes (Signed)
Also c/o HA and sore throat, did not have a flu vaccine this year

## 2015-05-24 NOTE — ED Notes (Signed)
MD at bedside. 

## 2015-05-24 NOTE — ED Notes (Signed)
1 liter NS infusing as per EDP orders

## 2015-05-24 NOTE — ED Notes (Signed)
Patient transported to X-ray via stretcher, sr x 2 up

## 2015-05-24 NOTE — ED Notes (Signed)
Pt up to bathroom, appears very weak

## 2015-05-24 NOTE — ED Notes (Signed)
Pt presents to the ED with nausea, no vomiting, poor appetite since this past Thursday.

## 2015-05-24 NOTE — Discharge Instructions (Signed)

## 2015-05-24 NOTE — ED Provider Notes (Signed)
CSN: [id number]     Arrival date & time 05/24/15  1104 History   First MD Initiated Contact with Patient 05/24/15 1111     Chief Complaint  Patient presents with  . Shortness of Breath     HPI  Patient presents for evaluation headache sore throat nausea vomiting and poor appetite for the last 3 days.  Generalized complaints. Mild headache. Not severe sudden. No neck pain or stiffness. Dry sore throat. Occasional cough. Shortness of breath and weakness with exertion. Poor appetite and nausea. Only one or 2 episodes of vomiting. No diarrhea. No urinary symptoms. Mild diffuse myalgias. Was not immunized for influenza.  Past Medical History  Diagnosis Date  . Diabetes mellitus ([place]) 05/21/2014  . Tobacco use 05/21/2014   Past Surgical History  Procedure Laterality Date  . Knee arthroscopy    . Tonsillectomy    . Irrigation and debridement abscess Right 05/21/2014    Procedure: IRRIGATION AND DEBRIDEMENT ABSCESS;  Surgeon: Armandina Gemma, MD;  Location: [facility];  Service: General;  Laterality: Right;  . Breast surgery     No family history on file. Social History  Substance Use Topics  . Smoking status: Current Every Day Smoker -- 0.50 packs/day    Types: Cigarettes  . Smokeless tobacco: Never Used  . Alcohol Use: No   OB History    No data available     Review of Systems  Constitutional: Positive for chills, activity change, appetite change and fatigue. Negative for fever and diaphoresis.  HENT: Positive for sore throat. Negative for mouth sores and trouble swallowing.   Eyes: Negative for visual disturbance.  Respiratory: Negative for cough, chest tightness, shortness of breath and wheezing.   Cardiovascular: Negative for chest pain.  Gastrointestinal: Positive for nausea and vomiting. Negative for abdominal pain, diarrhea and abdominal distention.  Endocrine: Negative for polydipsia, polyphagia and polyuria.  Genitourinary: Negative for dysuria, frequency and hematuria.   Musculoskeletal: Positive for myalgias. Negative for gait problem.  Skin: Negative for color change, pallor and rash.  Neurological: Positive for weakness. Negative for dizziness, syncope, light-headedness and headaches.  Hematological: Does not bruise/bleed easily.  Psychiatric/Behavioral: Negative for behavioral problems and confusion.      Allergies  Aspirin  Home Medications   Prior to Admission medications   Medication Sig Start Date End Date Taking? Authorizing Provider  amoxicillin-clavulanate (AUGMENTIN) 875-125 MG per tablet Take 1 tablet by mouth every 12 (twelve) hours. Patient taking differently: Take 1 tablet by mouth every 12 (twelve) hours. She is to take for 7 days. She started on 05/20/14 and has not completed. 05/20/14   Quintella Reichert, MD  ciprofloxacin (CIPRO) 500 MG tablet Take 1 tablet (500 mg total) by mouth every 12 (twelve) hours. 05/24/15   Tanna Furry, MD  HYDROcodone-acetaminophen (NORCO/VICODIN) 5-325 MG tablet Take 1 tablet by mouth every 4 (four) hours as needed. 05/24/15   Tanna Furry, MD  ondansetron (ZOFRAN ODT) 4 MG disintegrating tablet Take 1 tablet (4 mg total) by mouth every 8 (eight) hours as needed for nausea. 05/24/15   Tanna Furry, MD   BP 131/84 mmHg  Pulse 80  Temp(Src) 98.1 F (36.7 C) (Oral)  Resp 24  Ht 5\' 8"  (1.727 m)  Wt 200 lb (90.719 kg)  BMI 30.42 kg/m2  SpO2 99% Physical Exam  Constitutional: She is oriented to person, place, and time. She appears well-developed and well-nourished. No distress.  HENT:  Head: Normocephalic.  Pharynx appears normal.  Eyes: Conjunctivae are normal. Pupils are  equal, round, and reactive to light. No scleral icterus.  Neck: Normal range of motion. Neck supple. No thyromegaly present.  Cardiovascular: Normal rate and regular rhythm.  Exam reveals no gallop and no friction rub.   No murmur heard. Pulmonary/Chest: Effort normal and breath sounds normal. No respiratory distress. She has no wheezes. She has no  rales.  Clear bilateral breath sounds.  Abdominal: Soft. Bowel sounds are normal. She exhibits no distension. There is no tenderness. There is no rebound.  Musculoskeletal: Normal range of motion.  Lymphadenopathy:    She has no cervical adenopathy.  Neurological: She is alert and oriented to person, place, and time.  Skin: Skin is warm and dry. No rash noted.  Psychiatric: She has a normal mood and affect. Her behavior is normal.    ED Course  Procedures (including critical care time) Labs Review Labs Reviewed  CBC WITH DIFFERENTIAL/PLATELET - Abnormal; Notable for the following:    Monocytes Absolute 1.2 (*)    All other components within normal limits  BASIC METABOLIC PANEL - Abnormal; Notable for the following:    Potassium 3.3 (*)    Glucose, Bld 161 (*)    All other components within normal limits  URINALYSIS, ROUTINE W REFLEX MICROSCOPIC (NOT AT [facility]) - Abnormal; Notable for the following:    Color, Urine AMBER (*)    APPearance CLOUDY (*)    Glucose, UA 100 (*)    Hgb urine dipstick LARGE (*)    Ketones, ur 15 (*)    Protein, ur 100 (*)    Nitrite POSITIVE (*)    Leukocytes, UA LARGE (*)    All other components within normal limits  URINE MICROSCOPIC-ADD ON - Abnormal; Notable for the following:    Squamous Epithelial / LPF 0-5 (*)    Bacteria, UA MANY (*)    All other components within normal limits  CBG MONITORING, ED - Abnormal; Notable for the following:    Glucose-Capillary 147 (*)    All other components within normal limits  TROPONIN I    Imaging Review Dg Chest 2 View  05/24/2015  CLINICAL DATA:  Short of breath 4 days EXAM: CHEST  2 VIEW COMPARISON:  None. FINDINGS: The heart size and mediastinal contours are within normal limits. Both lungs are clear. The visualized skeletal structures are unremarkable. IMPRESSION: No active cardiopulmonary disease. Electronically Signed   By: Franchot Gallo M.D.   On: 05/24/2015 12:14   I have personally reviewed and  evaluated these images and lab results as part of my medical decision-making.   EKG Interpretation None      MDM   Final diagnoses:  Pyelonephritis    Normal x-ray. No leukocytosis. Slight elevation of glucose 147. Urine appears infected with large urine 6-30 white blood cells too numerous to count WBCs positive bacteria and nitrates. Given IV Rocephin. IV fluids. Headache has improved. Plan is home, antiemetics, rest, push fluids dehydrated, Cipro. Primary care follow-up. ER with worsening symptoms.    Tanna Furry, MD 05/24/15 1302

## 2016-08-10 IMAGING — DX DG CHEST 2V
2 series · 2 of 2 positions shown · non-contrast
Comparison: None.

CLINICAL DATA: Short of breath 4 days

EXAM:
CHEST  2 VIEW

[chest pa]
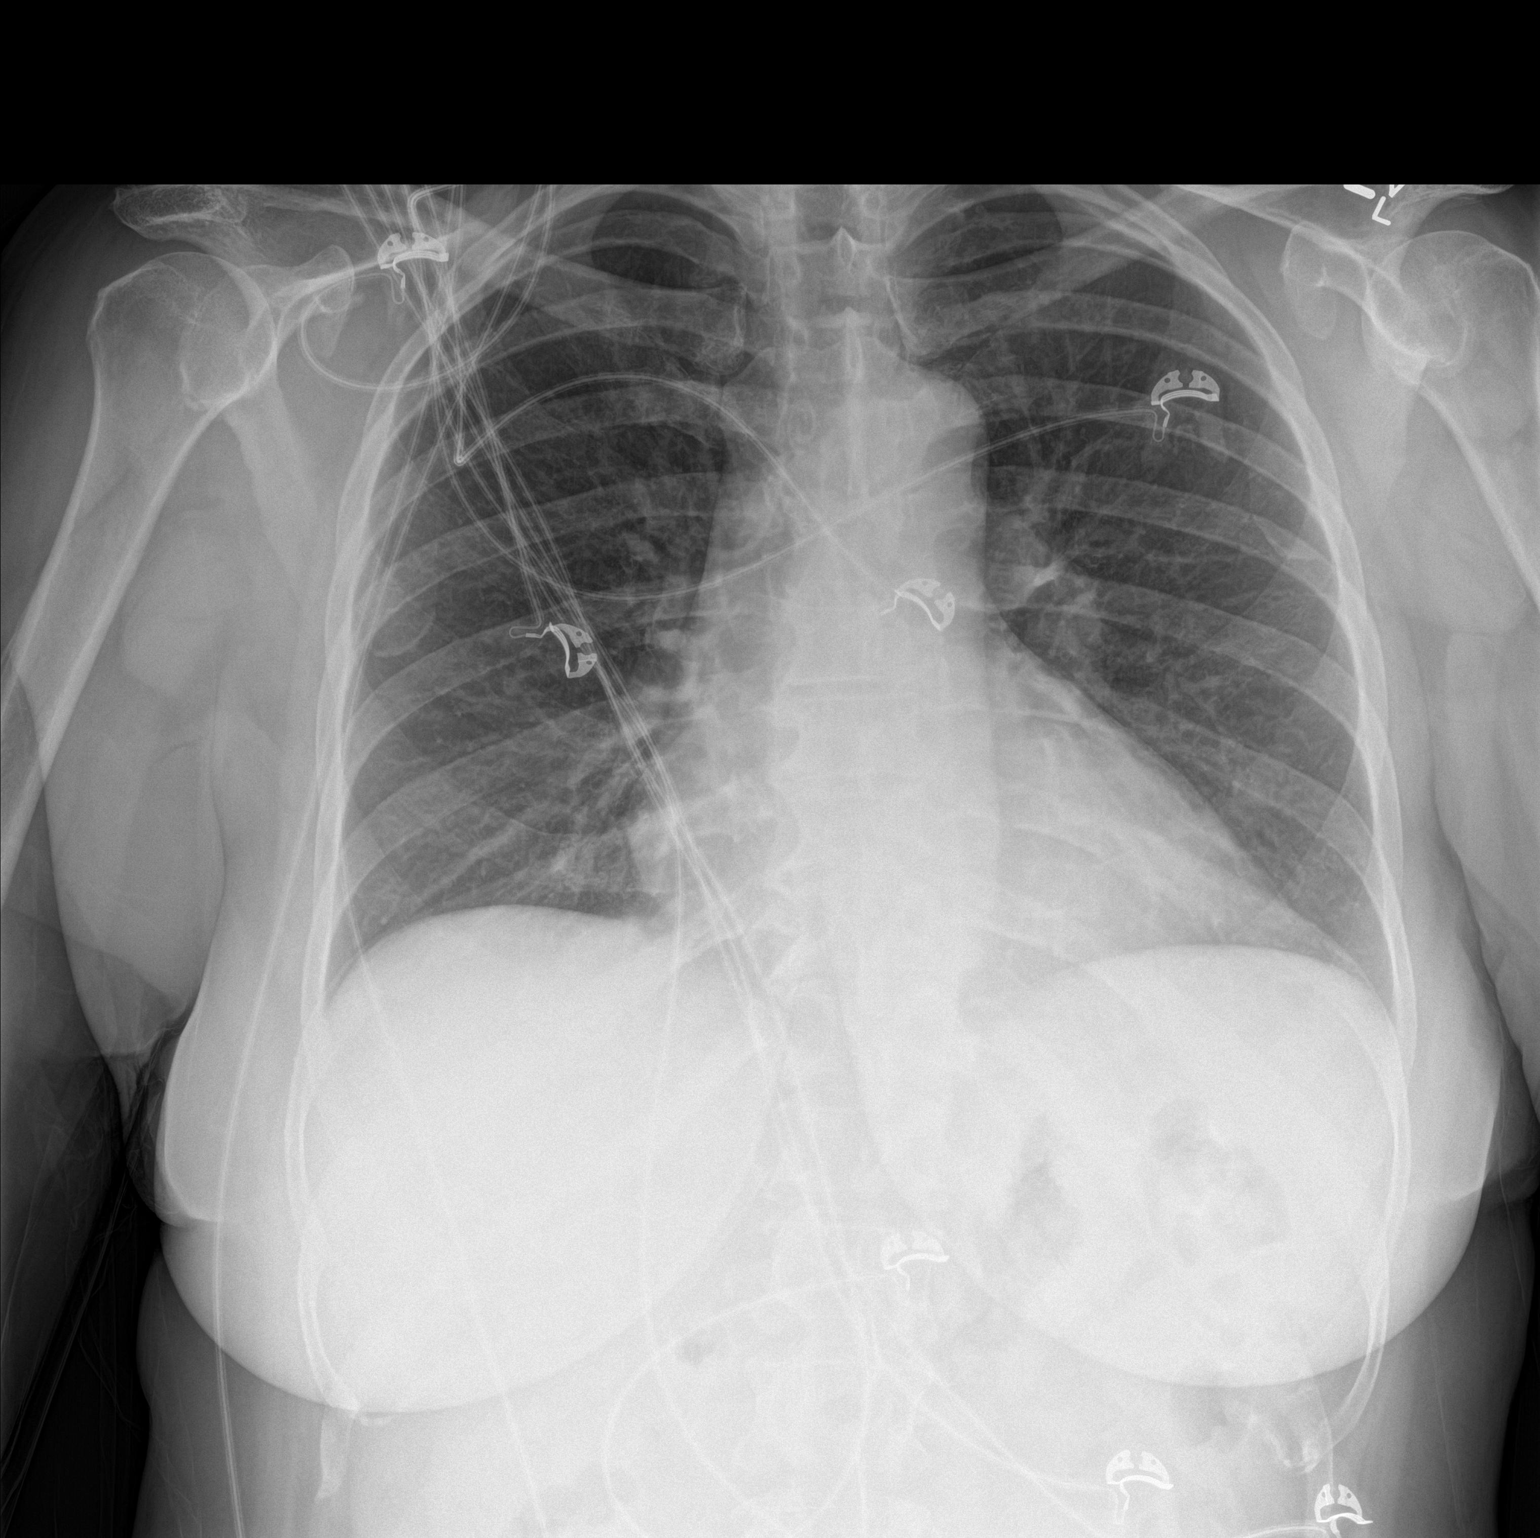

[chest lat]
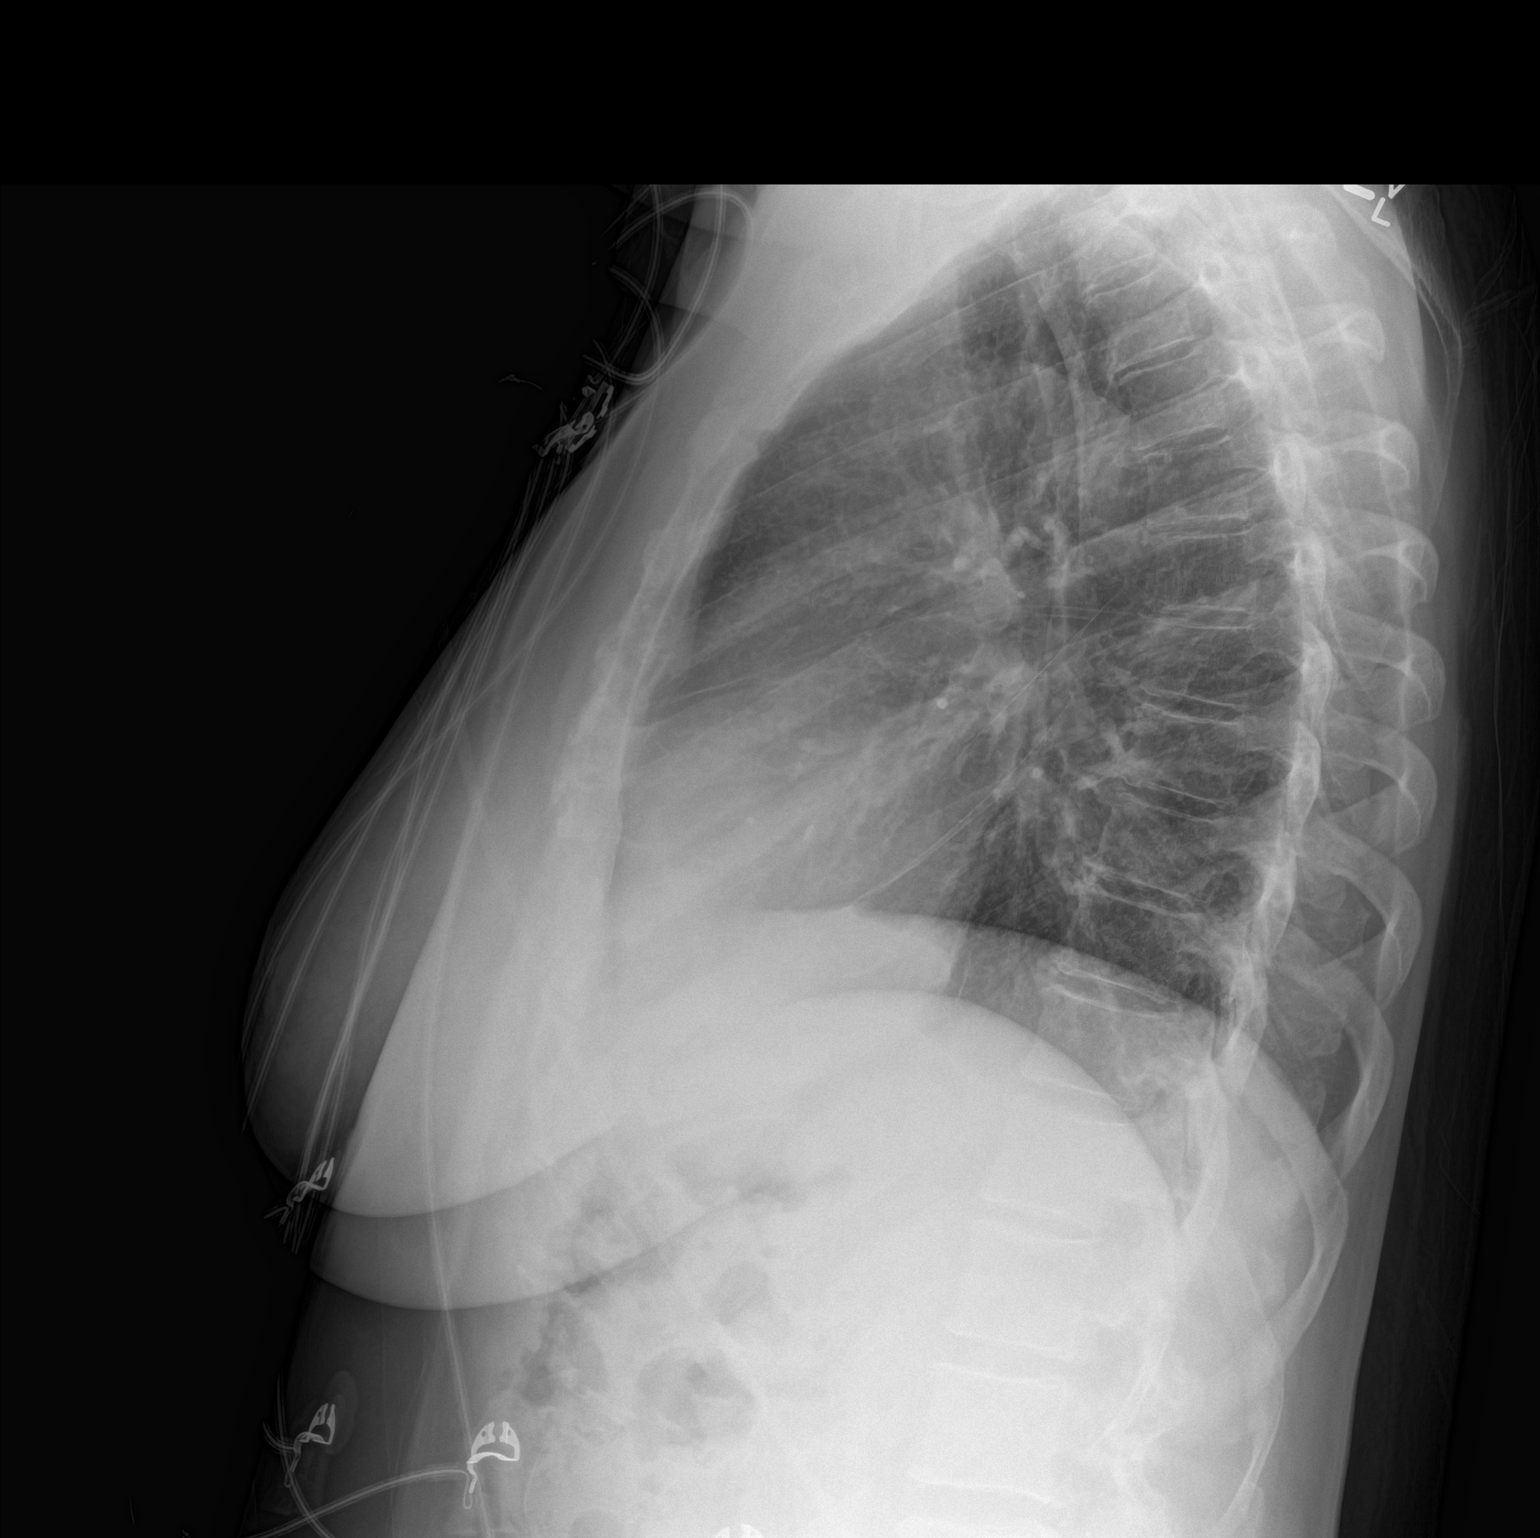

[2 of 2 positions shown; findings below may reference images not displayed]

FINDINGS: The heart size and mediastinal contours are within normal limits.
Both lungs are clear. The visualized skeletal structures are
unremarkable.
IMPRESSION: No active cardiopulmonary disease.

## 2019-12-20 ENCOUNTER — Encounter (HOSPITAL_BASED_OUTPATIENT_CLINIC_OR_DEPARTMENT_OTHER): Payer: Self-pay

## 2019-12-20 ENCOUNTER — Other Ambulatory Visit: Payer: Self-pay

## 2019-12-20 ENCOUNTER — Emergency Department (HOSPITAL_BASED_OUTPATIENT_CLINIC_OR_DEPARTMENT_OTHER)
Admission: EM | Admit: 2019-12-20 | Discharge: 2019-12-21 | Disposition: A | Discharge status: Home / Self Care | Payer: BLUE CROSS/BLUE SHIELD | Attending: Emergency Medicine | Admitting: Emergency Medicine

## 2019-12-20 DIAGNOSIS — F17210 Nicotine dependence, cigarettes, uncomplicated: Secondary | ICD-10-CM | POA: Diagnosis not present

## 2019-12-20 DIAGNOSIS — C801 Malignant (primary) neoplasm, unspecified: Secondary | ICD-10-CM | POA: Insufficient documentation

## 2019-12-20 DIAGNOSIS — M792 Neuralgia and neuritis, unspecified: Secondary | ICD-10-CM | POA: Insufficient documentation

## 2019-12-20 DIAGNOSIS — E1140 Type 2 diabetes mellitus with diabetic neuropathy, unspecified: Secondary | ICD-10-CM | POA: Insufficient documentation

## 2019-12-20 DIAGNOSIS — M79673 Pain in unspecified foot: Secondary | ICD-10-CM | POA: Insufficient documentation

## 2019-12-20 DIAGNOSIS — R202 Paresthesia of skin: Secondary | ICD-10-CM | POA: Diagnosis not present

## 2019-12-20 DIAGNOSIS — Z79899 Other long term (current) drug therapy: Secondary | ICD-10-CM | POA: Insufficient documentation

## 2019-12-20 DIAGNOSIS — G629 Polyneuropathy, unspecified: Secondary | ICD-10-CM | POA: Diagnosis not present

## 2019-12-20 HISTORY — DX: Malignant (primary) neoplasm, unspecified: C80.1

## 2019-12-20 HISTORY — DX: Polyneuropathy, unspecified: G62.9

## 2019-12-20 NOTE — ED Triage Notes (Signed)
Pt c/o increasing pain and numbness in her feet. Pt currently being treated for peripheral neuropathy and has had her neurontin increased multiple times recently without relief. Pt states she has been on 900mg  of neurontin bid for past 2 weeks.

## 2019-12-21 ENCOUNTER — Other Ambulatory Visit: Payer: Self-pay

## 2019-12-21 ENCOUNTER — Emergency Department (HOSPITAL_BASED_OUTPATIENT_CLINIC_OR_DEPARTMENT_OTHER)
Admission: EM | Admit: 2019-12-21 | Discharge: 2019-12-21 | Disposition: A | Discharge status: Home / Self Care | Payer: BLUE CROSS/BLUE SHIELD | Source: Home / Self Care | Attending: Emergency Medicine | Admitting: Emergency Medicine

## 2019-12-21 ENCOUNTER — Encounter (HOSPITAL_BASED_OUTPATIENT_CLINIC_OR_DEPARTMENT_OTHER): Payer: Self-pay | Admitting: Emergency Medicine

## 2019-12-21 DIAGNOSIS — M792 Neuralgia and neuritis, unspecified: Secondary | ICD-10-CM | POA: Insufficient documentation

## 2019-12-21 DIAGNOSIS — E1140 Type 2 diabetes mellitus with diabetic neuropathy, unspecified: Secondary | ICD-10-CM | POA: Insufficient documentation

## 2019-12-21 DIAGNOSIS — F17210 Nicotine dependence, cigarettes, uncomplicated: Secondary | ICD-10-CM | POA: Insufficient documentation

## 2019-12-21 DIAGNOSIS — Z79899 Other long term (current) drug therapy: Secondary | ICD-10-CM | POA: Insufficient documentation

## 2019-12-21 DIAGNOSIS — C801 Malignant (primary) neoplasm, unspecified: Secondary | ICD-10-CM | POA: Insufficient documentation

## 2019-12-21 MED ORDER — OXYCODONE HCL 5 MG PO TABS: 5.0000 mg | ORAL_TABLET | ORAL | 0 refills | Status: AC | PRN

## 2019-12-21 NOTE — ED Notes (Signed)
Pt called multiple times in lobby and outside. No response

## 2019-12-21 NOTE — ED Triage Notes (Signed)
Pt is c/o numbness in her feet x 2 months  Pt states they burn and she cannot sleep

## 2019-12-21 NOTE — Discharge Instructions (Signed)
Continue to increase your gabapentin dosage slowly to total 900mg  three times daily as directed by your doctor.  The medication I am prescribing is to help with severe breakthrough pain and is NOT a long term solution and in fact will make your pain worse in the long run if you rely on it.  It is addicting and constipating. You need to continue to work with your physicians for pain control including increasing gabapentin and looking for other options as well as evaluating for underlying etiology of your neuropathic pain.

## 2019-12-23 NOTE — ED Provider Notes (Signed)
[facility] Provider Note   CSN: [id number] Arrival date & time: 12/21/19  3016     History Chief Complaint  Patient presents with  . Numbness    Sheena Ward is a 59 y.o. female.  HPI      59yo female with history of DM, peripheral neuropathy, breast cancer, presents with concern for bilateral burning pain in her legs.  It has been going on for about 2-3 months however significantly worsened in the last few days. Reports difficulty walking due to pain. No fevers. No vomiting. No other concerns. No back pain/trauma/falls.  Taking gabapentin 900mg  BID without improvement.  Past Medical History:  Diagnosis Date  . Cancer ([place])   . Diabetes mellitus ([place]) 05/21/2014  . Peripheral neuropathy   . Tobacco use 05/21/2014    Patient Active Problem List   Diagnosis Date Noted  . Diabetes mellitus ([place]) 05/21/2014  . Tobacco use 05/21/2014  . Abscess of right breast 05/21/2014  . Breast abscess 05/21/2014    Past Surgical History:  Procedure Laterality Date  . BREAST SURGERY    . IRRIGATION AND DEBRIDEMENT ABSCESS Right 05/21/2014   Procedure: IRRIGATION AND DEBRIDEMENT ABSCESS;  Surgeon: Armandina Gemma, MD;  Location: [facility];  Service: General;  Laterality: Right;  . KNEE ARTHROSCOPY    . TONSILLECTOMY       OB History   No obstetric history on file.     Family History  Problem Relation Age of Onset  . Cancer Other   . Diabetes Other     Social History   Tobacco Use  . Smoking status: Current Every Day Smoker    Packs/day: 0.50    Types: Cigarettes  . Smokeless tobacco: Never Used  Vaping Use  . Vaping Use: Never used  Substance Use Topics  . Alcohol use: No  . Drug use: No    Home Medications Prior to Admission medications   Medication Sig Start Date End Date Taking? Authorizing Provider  amoxicillin-clavulanate (AUGMENTIN) 875-125 MG per tablet Take 1 tablet by mouth every 12 (twelve) hours. Patient taking differently: Take  1 tablet by mouth every 12 (twelve) hours. She is to take for 7 days. She started on 05/20/14 and has not completed. 05/20/14   Quintella Reichert, MD  ciprofloxacin (CIPRO) 500 MG tablet Take 1 tablet (500 mg total) by mouth every 12 (twelve) hours. 05/24/15   Tanna Furry, MD  ondansetron (ZOFRAN ODT) 4 MG disintegrating tablet Take 1 tablet (4 mg total) by mouth every 8 (eight) hours as needed for nausea. 05/24/15   Tanna Furry, MD  oxyCODONE (ROXICODONE) 5 MG immediate release tablet Take 1 tablet (5 mg total) by mouth every 4 (four) hours as needed for severe pain. 12/21/19   Gareth Morgan, MD    Allergies    Aspirin  Review of Systems   Review of Systems  Constitutional: Negative for fever.  HENT: Negative for sore throat.   Respiratory: Negative for cough and shortness of breath.   Cardiovascular: Negative for chest pain.  Gastrointestinal: Negative for abdominal pain.  Genitourinary: Negative for difficulty urinating.  Musculoskeletal: Positive for arthralgias and myalgias. Negative for back pain and neck pain.  Skin: Negative for rash.  Neurological: Negative for syncope.    Physical Exam Updated Vital Signs BP (!) 156/84 (BP Location: Right Arm)   Pulse 74   Temp 98.4 F (36.9 C) (Oral)   Resp 15   Ht 5\' 8"  (1.727 m)   Wt 81.6  kg   SpO2 99%   BMI 27.37 kg/m   Physical Exam Vitals and nursing note reviewed.  Constitutional:      General: She is not in acute distress.    Appearance: Normal appearance. She is not ill-appearing, toxic-appearing or diaphoretic.  HENT:     Head: Normocephalic.  Eyes:     Conjunctiva/sclera: Conjunctivae normal.  Cardiovascular:     Rate and Rhythm: Normal rate and regular rhythm.     Pulses: Normal pulses.  Pulmonary:     Effort: Pulmonary effort is normal. No respiratory distress.  Musculoskeletal:        General: No deformity or signs of injury.     Cervical back: No rigidity.     Comments: Normal PT/DP pulses bilaterally, feet warm, no  erythema, no swelling, normal strength, normal ROM  Skin:    General: Skin is warm and dry.     Coloration: Skin is not jaundiced or pale.  Neurological:     General: No focal deficit present.     Mental Status: She is alert and oriented to person, place, and time.     ED Results / Procedures / Treatments   Labs (all labs ordered are listed, but only abnormal results are displayed) Labs Reviewed - No data to display  EKG None  Radiology No results found.  Procedures Procedures (including critical care time)  Medications Ordered in ED Medications - No data to display  ED Course  I have reviewed the triage vital signs and the nursing notes.  Pertinent labs & imaging results that were available during my care of the patient were reviewed by me and considered in my medical decision making (see chart for details).    MDM Rules/Calculators/A&P                          59yo female with history of DM, peripheral neuropathy, breast cancer, presents with concern for bilateral burning pain in her legs.  Normal pulses, no sign of acute arterial injury. No swelling or signs of DVT.  No signs of septic arthriits or cellulitis. No symptoms to suggest back pain emergency. No trauma doubt fx and has previous imaging which shows mild-mod degenerative changes of midfoot without other acute changes. Labs obtained yesterday show no sign of significant electrolyte abnormality. Given cancer hx and pain that has not been helped with several options and this being holiday weekend feel short course of narcotic pain medications is reasonable but did discuss in detail that any further pain control discussions will need to be had with PCP. Discussed risks of narcotic medications and addiction/worsening pain.  SHe can continue increasing the gabapentin as rx. Patient discharged in stable condition with understanding of reasons to return.    Final Clinical Impression(s) / ED Diagnoses Final diagnoses:    Neuropathic pain    Rx / DC Orders ED Discharge Orders         Ordered    oxyCODONE (ROXICODONE) 5 MG immediate release tablet  Every 4 hours PRN        12/21/19 0930           Gareth Morgan, MD 12/23/19 1007
